# Patient Record
Sex: Female | Born: 1965 | Race: White | Hispanic: No | Smoking: Never smoker
Health system: Southern US, Community
[De-identification: ages and names within clinical notes are randomized; demographics above are authoritative.]

## PROBLEM LIST (undated history)

## (undated) DIAGNOSIS — F988 Other specified behavioral and emotional disorders with onset usually occurring in childhood and adolescence: Secondary | ICD-10-CM

## (undated) HISTORY — PX: FOOT SURGERY: SHX648

## (undated) HISTORY — PX: LEG SURGERY: SHX1003

---

## 2015-08-06 ENCOUNTER — Emergency Department (HOSPITAL_BASED_OUTPATIENT_CLINIC_OR_DEPARTMENT_OTHER): Payer: BLUE CROSS/BLUE SHIELD

## 2015-08-06 ENCOUNTER — Encounter (HOSPITAL_BASED_OUTPATIENT_CLINIC_OR_DEPARTMENT_OTHER): Payer: Self-pay | Admitting: Emergency Medicine

## 2015-08-06 ENCOUNTER — Emergency Department (HOSPITAL_BASED_OUTPATIENT_CLINIC_OR_DEPARTMENT_OTHER)
Admission: EM | Admit: 2015-08-06 | Discharge: 2015-08-06 | Disposition: A | Discharge status: Home / Self Care | Payer: BLUE CROSS/BLUE SHIELD | Attending: Emergency Medicine | Admitting: Emergency Medicine

## 2015-08-06 DIAGNOSIS — Z23 Encounter for immunization: Secondary | ICD-10-CM | POA: Insufficient documentation

## 2015-08-06 DIAGNOSIS — S80211A Abrasion, right knee, initial encounter: Secondary | ICD-10-CM | POA: Diagnosis not present

## 2015-08-06 DIAGNOSIS — S01111A Laceration without foreign body of right eyelid and periocular area, initial encounter: Secondary | ICD-10-CM | POA: Diagnosis not present

## 2015-08-06 DIAGNOSIS — S5001XA Contusion of right elbow, initial encounter: Secondary | ICD-10-CM | POA: Diagnosis not present

## 2015-08-06 DIAGNOSIS — Z79899 Other long term (current) drug therapy: Secondary | ICD-10-CM | POA: Diagnosis not present

## 2015-08-06 DIAGNOSIS — W07XXXA Fall from chair, initial encounter: Secondary | ICD-10-CM | POA: Diagnosis not present

## 2015-08-06 DIAGNOSIS — T07XXXA Unspecified multiple injuries, initial encounter: Secondary | ICD-10-CM

## 2015-08-06 DIAGNOSIS — Y929 Unspecified place or not applicable: Secondary | ICD-10-CM | POA: Insufficient documentation

## 2015-08-06 DIAGNOSIS — Y939 Activity, unspecified: Secondary | ICD-10-CM | POA: Diagnosis not present

## 2015-08-06 DIAGNOSIS — Y999 Unspecified external cause status: Secondary | ICD-10-CM | POA: Diagnosis not present

## 2015-08-06 DIAGNOSIS — M25521 Pain in right elbow: Secondary | ICD-10-CM | POA: Diagnosis present

## 2015-08-06 HISTORY — DX: Other specified behavioral and emotional disorders with onset usually occurring in childhood and adolescence: F98.8

## 2015-08-06 MED ORDER — OXYCODONE-ACETAMINOPHEN 5-325 MG PO TABS
1.0000 | ORAL_TABLET | Freq: Once | ORAL | Status: AC
  Administered 2015-08-06: 1 via ORAL

## 2015-08-06 MED ORDER — OXYCODONE-ACETAMINOPHEN 5-325 MG PO TABS
ORAL_TABLET | ORAL | Status: AC
  Filled 2015-08-06: qty 1

## 2015-08-06 MED ORDER — TRAMADOL HCL 50 MG PO TABS: 50.0000 mg | ORAL_TABLET | Freq: Four times a day (QID) | ORAL | Status: AC | PRN

## 2015-08-06 MED ORDER — TETANUS-DIPHTH-ACELL PERTUSSIS 5-2.5-18.5 LF-MCG/0.5 IM SUSP
0.5000 mL | Freq: Once | INTRAMUSCULAR | Status: AC
  Administered 2015-08-06: 0.5 mL via INTRAMUSCULAR
  Filled 2015-08-06: qty 0.5

## 2015-08-06 MED ORDER — OXYCODONE-ACETAMINOPHEN 5-325 MG PO TABS: 2.0000 | ORAL_TABLET | Freq: Once | ORAL | Status: DC

## 2015-08-06 NOTE — ED Notes (Signed)
MD at bedside. 

## 2015-08-06 NOTE — ED Provider Notes (Signed)
CSN: [id number]     Arrival date & time 08/06/15  1744 History  By signing my name below, I, Teresa Gray, attest that this documentation has been prepared under the direction and in the presence of Rolan BuccoMelanie Jarryd Gratz, MD. Electronically Signed: Bridgette HabermannMaria Gray, ED Scribe. 08/06/2015. 6:34 PM.       Chief Complaint  Patient presents with  . Joint Swelling    The history is provided by the patient and the spouse. No language interpreter was used.    HPI Comments: Teresa Gray is a 50 y.o. female who presents to the Emergency Department complaining of right elbow pain s/p mechanical fall this morning. Patient reports that she tripped on a chair, fell to the right and landed on her right side, subsequently striking her head on the ground. Denies LOC or other known injuries. She also complains of mild right wrist pain and abrasions to the lower extremities. Pt is ambulatory without difficulty. Tdap out of date. Patient denies dizziness, lightheadedness, HA, nausea, and vomiting. Dr. Louis MatteKenneth Lennon is her Orthopedic doctor.  Past Medical History  Diagnosis Date  . ADD (attention deficit disorder)    Past Surgical History  Procedure Laterality Date  . Foot surgery    . Leg surgery     History reviewed. No pertinent family history. Social History  Substance Use Topics  . Smoking status: Never Smoker   . Smokeless tobacco: None  . Alcohol Use: Yes     Comment: occ   OB History    No data available     Review of Systems  Constitutional: Negative for fever.  Gastrointestinal: Negative for nausea and vomiting.  Musculoskeletal: Positive for joint swelling (right elbow) and arthralgias (right elbow). Negative for back pain and neck pain.  Skin: Positive for wound.  Neurological: Negative for dizziness, syncope, weakness, numbness and headaches.  All other systems reviewed and are negative.   Allergies  Augmentin  Home Medications   Prior to Admission medications   Medication Sig Start Date  End Date Taking? Authorizing Provider  lisdexamfetamine (VYVANSE) 40 MG capsule Take 40 mg by mouth every morning.   Yes Historical Provider, MD  traMADol (ULTRAM) 50 MG tablet Take 1 tablet (50 mg total) by mouth every 6 (six) hours as needed. 08/06/15   Rolan BuccoMelanie Atiba Kimberlin, MD   BP 103/62 mmHg  Pulse 70  Temp(Src) 98 F (36.7 C) (Oral)  Resp 18  Ht 5\' 7"  (1.702 m)  Wt 150 lb (68.04 kg)  BMI 23.49 kg/m2  SpO2 98%  LMP 08/04/2015 Physical Exam  Constitutional: She is oriented to person, place, and time. She appears well-developed and well-nourished.  HENT:  Head: Normocephalic.  Neck: Normal range of motion. Neck supple.  No pain to cervical spine  Cardiovascular: Normal rate.   Pulmonary/Chest: Effort normal.  Musculoskeletal: She exhibits edema and tenderness.  One cm superficial laceration above right eyebrow. No hematoma. Tenderness over medial and lateral aspects of right elbow with mild swelling. Superficial abrasions to lower extremities. Mild swelling with overlying abrasion to right knee but no underlying bony tenderness. No other pain with palpation or ROM of other extremities. No pain with ROM of right shoulder. Mild pain with ROM of right wrist  Neurological: She is alert and oriented to person, place, and time.  Skin: Skin is warm and dry.  Psychiatric: She has a normal mood and affect.  Nursing note and vitals reviewed.   ED Course  Procedures (including critical care time) DIAGNOSTIC STUDIES: Oxygen Saturation is  98% on RA, normal by my interpretation.    COORDINATION OF CARE: 6:15 PM Discussed treatment plan with pt at bedside which includes x-ray of the elbow and pt agreed to plan.  Imaging Review Dg Elbow Complete Right  08/06/2015  CLINICAL DATA:  Status post fall, with right elbow pain. Initial encounter. EXAM: RIGHT ELBOW - COMPLETE 3+ VIEW COMPARISON:  None. FINDINGS: There is no evidence of fracture or dislocation. The visualized joint spaces are preserved. No  significant joint effusion is identified. The soft tissues are unremarkable in appearance. IMPRESSION: No evidence of fracture or dislocation. Electronically Signed   By: Roanna Raider M.D.   On: 08/06/2015 19:17   Dg Wrist Complete Right  08/06/2015  CLINICAL DATA:  Status post fall, with right wrist pain. Initial encounter. EXAM: RIGHT WRIST - COMPLETE 3+ VIEW COMPARISON:  None. FINDINGS: There is no evidence of fracture or dislocation. The carpal rows are intact, and demonstrate normal alignment. The joint spaces are preserved. No significant soft tissue abnormalities are seen. IMPRESSION: No evidence of fracture or dislocation. Electronically Signed   By: Roanna Raider M.D.   On: 08/06/2015 19:17   I have personally reviewed and evaluated these images as part of my medical decision-making.   MDM   Final diagnoses:  Multiple contusions  Abrasions of multiple sites    No fractures are identified. She's neurologically intact. She was advised in ice and elevation. She was given a sling to use for comfort. She was given a prescription for tramadol. She was encouraged to follow-up with her orthopedist.  I personally performed the services described in this documentation, which was scribed in my presence.  The recorded information has been reviewed and considered.      Rolan Bucco, MD 08/06/15 [phone]

## 2015-08-06 NOTE — ED Notes (Signed)
Pt refused to let me bring it to the proper height and angle and stated she would do it later. EMT told pt's husband how it should be worn and how to properly put the sling on.

## 2015-08-06 NOTE — ED Notes (Signed)
Patient states that she fell early this am and now having pain and swelling to her right elbow. [place]

## 2015-08-06 NOTE — ED Notes (Signed)
Pt reports feeling like passing out at discharge , discussed with EDP the pt's request for pain medication. Order received.

## 2020-12-23 ENCOUNTER — Other Ambulatory Visit (HOSPITAL_COMMUNITY): Payer: Self-pay | Admitting: Neurosurgery

## 2020-12-23 ENCOUNTER — Other Ambulatory Visit: Payer: Self-pay | Admitting: Neurosurgery

## 2020-12-23 DIAGNOSIS — G8929 Other chronic pain: Secondary | ICD-10-CM

## 2020-12-23 DIAGNOSIS — M5441 Lumbago with sciatica, right side: Secondary | ICD-10-CM

## 2021-01-04 ENCOUNTER — Ambulatory Visit (HOSPITAL_COMMUNITY)
Admission: RE | Admit: 2021-01-04 | Discharge: 2021-01-04 | Disposition: A | Discharge status: Home / Self Care | Payer: 59 | Source: Ambulatory Visit | Attending: Neurosurgery | Admitting: Neurosurgery

## 2021-01-04 ENCOUNTER — Other Ambulatory Visit: Payer: Self-pay

## 2021-01-04 DIAGNOSIS — M5441 Lumbago with sciatica, right side: Secondary | ICD-10-CM | POA: Diagnosis not present

## 2021-01-04 DIAGNOSIS — G8929 Other chronic pain: Secondary | ICD-10-CM | POA: Diagnosis present

## 2022-04-01 IMAGING — MR MR LUMBAR SPINE W/O CM
5 series · 31 of 48 positions shown · non-contrast
Comparison: None.

CLINICAL DATA: Chronic bilateral low back pain with right-sided
sciatica

EXAM:
MRI LUMBAR SPINE WITHOUT CONTRAST
TECHNIQUE: Multiplanar, multisequence MR imaging of the lumbar spine was
performed. No intravenous contrast was administered.

[Series 5: T1 · sagittal · 4.0mm · 0.81mm/px · 6 of 17 slices shown (1 of 2)]
[im 1/17]
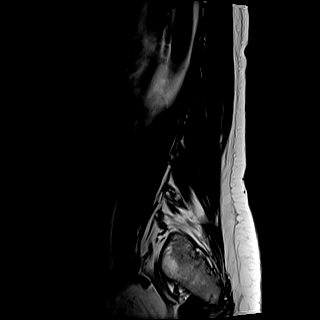
[im 4/17]
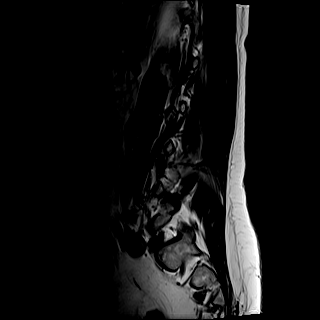
[im 7/17]
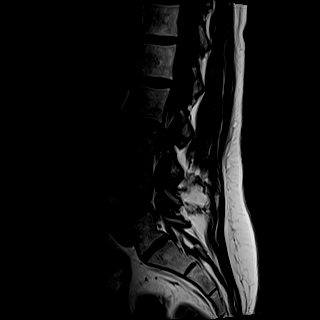
[im 10/17]
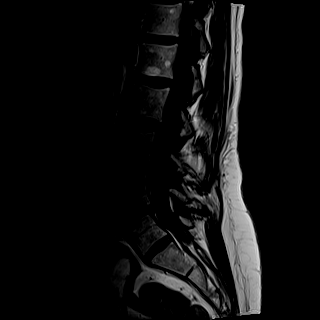
[im 13/17]
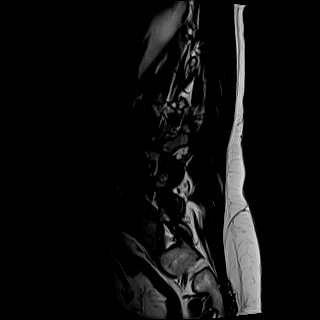
[im 17/17]
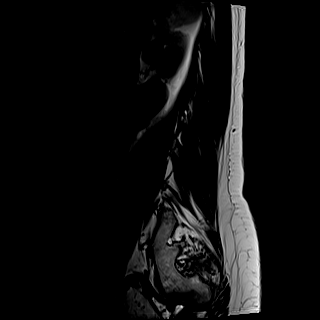

[Series 6: T2 · sagittal · 4.0mm · 0.81mm/px · 6 of 17 slices shown (1 of 2)]
[im 1/17]
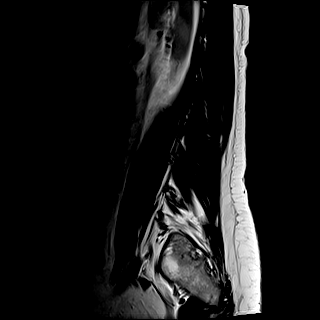
[im 4/17]
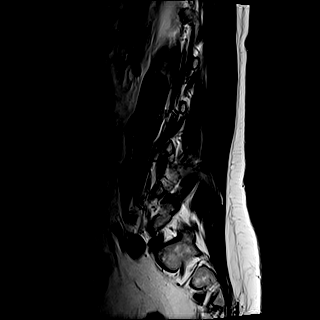
[im 7/17]
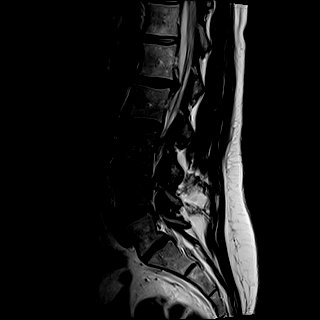
[im 10/17]
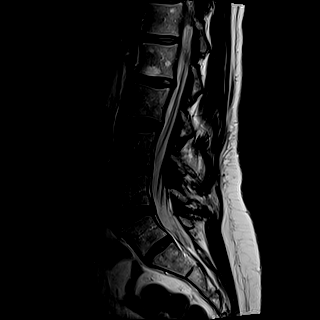
[im 13/17]
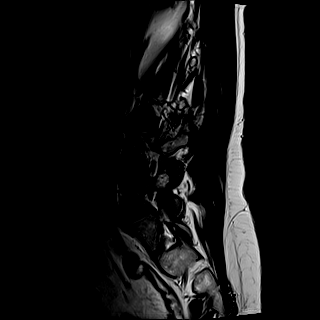
[im 17/17]
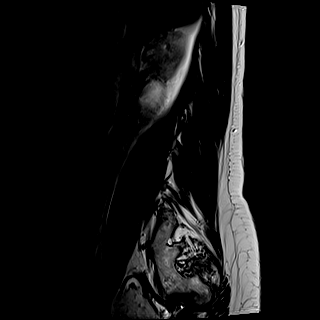

[Series 7: STIR · sagittal · 4.0mm · 0.51mm/px · 1 of 17 slices shown]
[im 1/17]
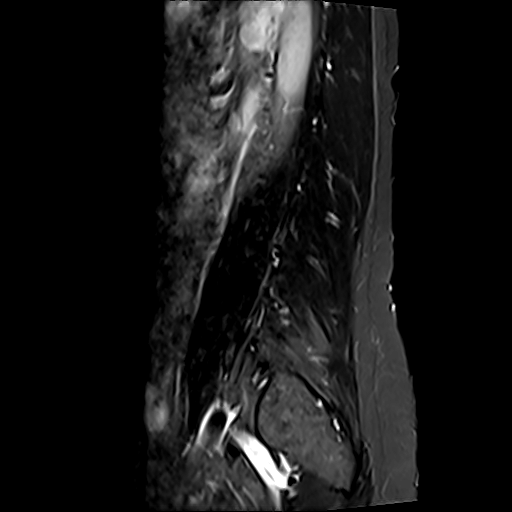

[Series 8: T2 · axial · 4.0mm · 0.62mm/px · z∈[-48,+174]mm · 9 of 42 slices shown (2 of 2)]
[im 1/42]
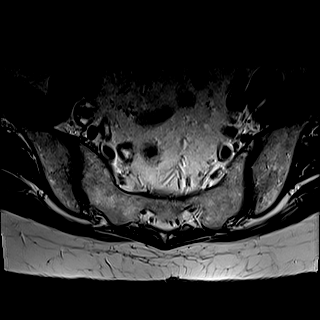
[im 6/42]
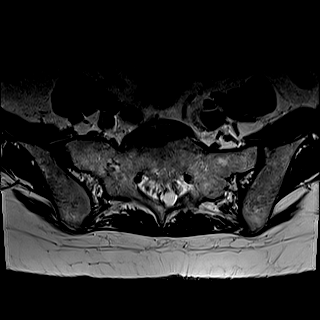
[im 12/42]
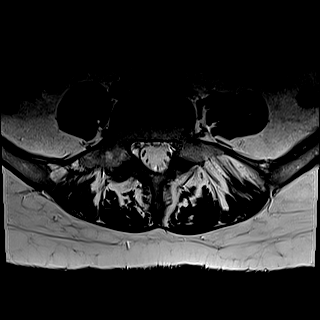
[im 18/42]
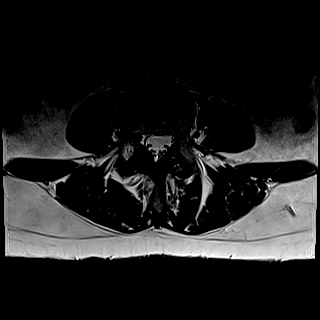
[im 21/42]
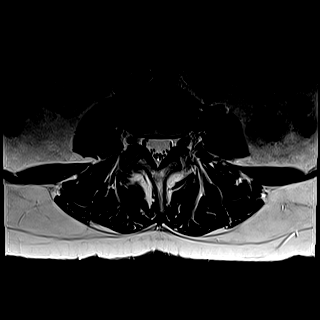
[im 24/42]
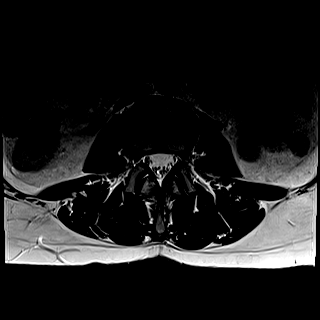
[im 30/42]
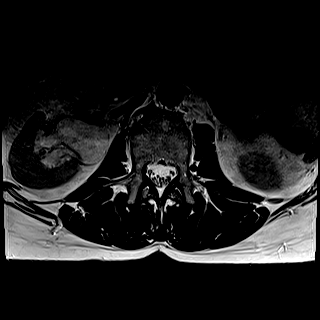
[im 36/42]
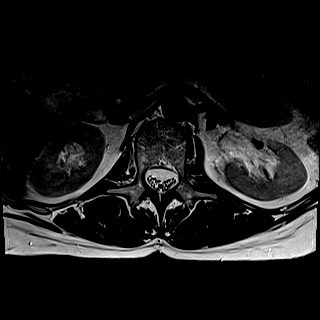
[im 42/42]
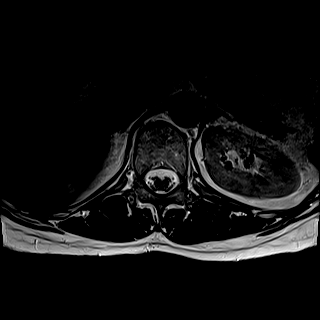

[Series 9: T1 · axial · 4.0mm · 0.39mm/px · z∈[-48,+174]mm · 9 of 42 slices shown (2 of 2)]
[im 1/42]
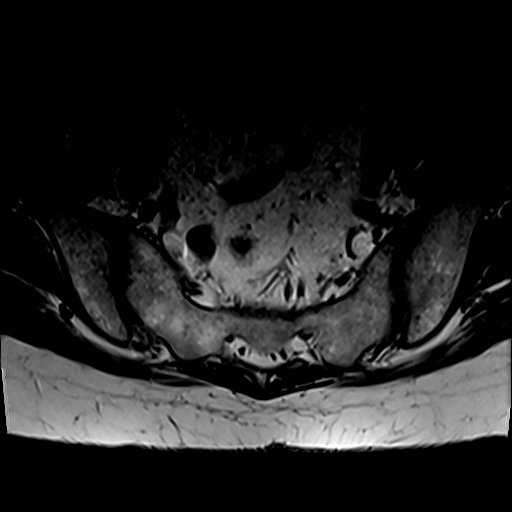
[im 6/42]
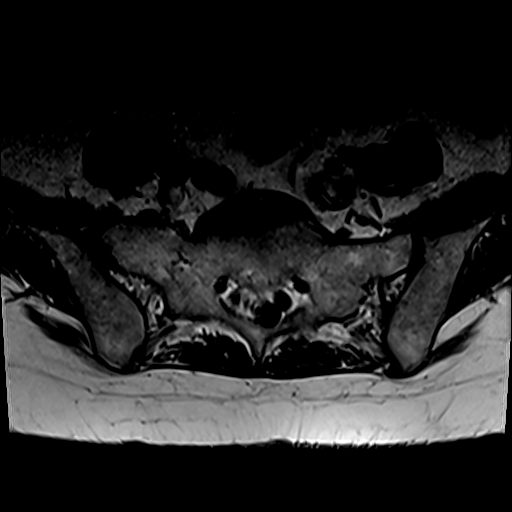
[im 12/42]
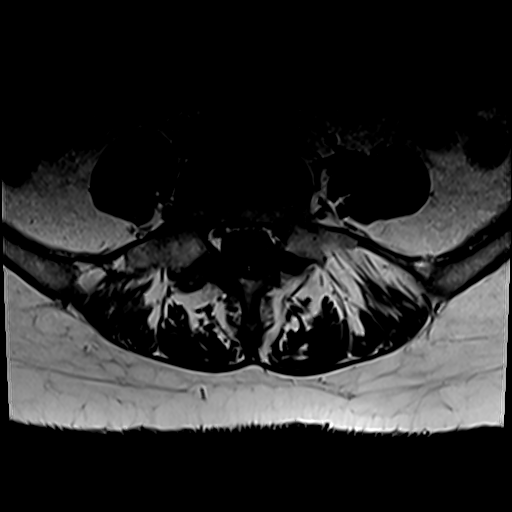
[im 18/42]
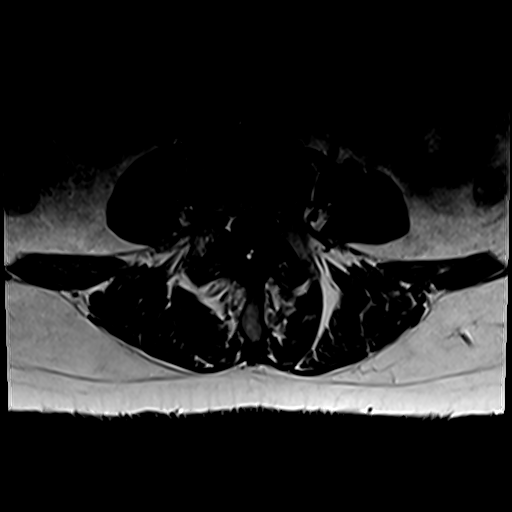
[im 21/42]
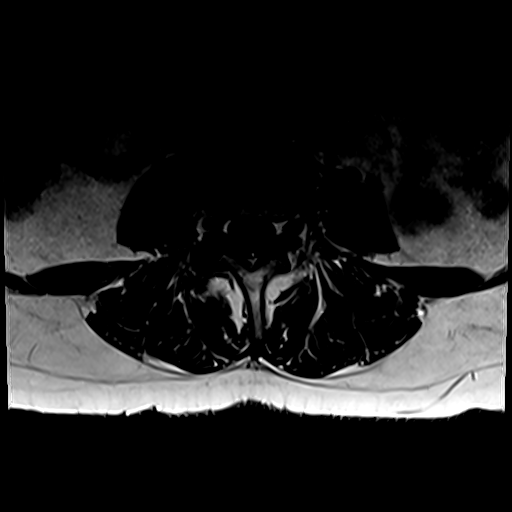
[im 24/42]
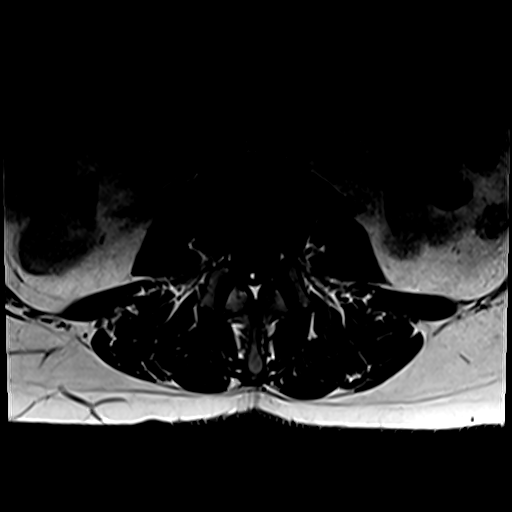
[im 30/42]
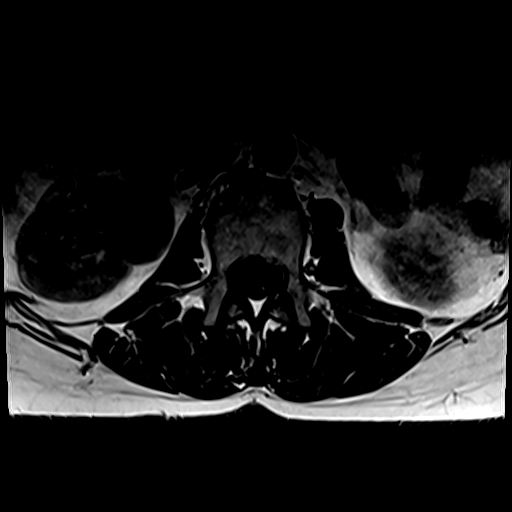
[im 36/42]
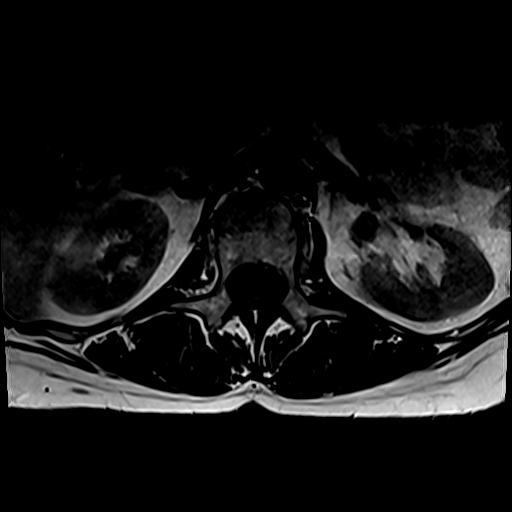
[im 42/42]
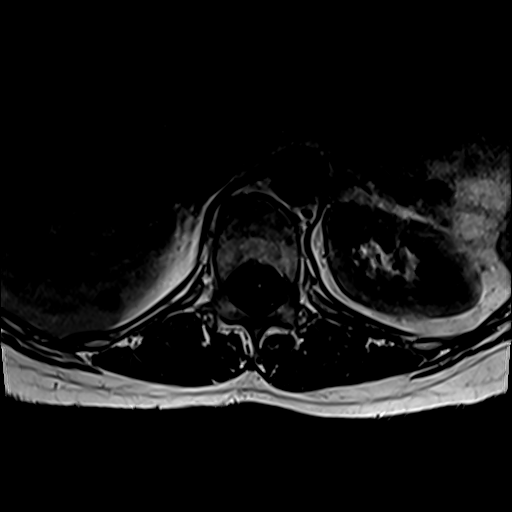

[31 of 48 positions shown; findings below may reference images not displayed]

FINDINGS: Segmentation: Transitional anatomy at the lumbosacral junction. For
the purposes of this dictation, there is partial sacralization of
L5.

Alignment:  Trace retrolisthesis at L2-L3.

Vertebrae: Vertebral body heights are maintained. Probable small
hemangioma involving the right posterior elements of L4. No marrow
edema. No suspicious osseous lesion.

Conus medullaris and cauda equina: Conus extends to the L1 level.
Conus and cauda equina appear normal.

Paraspinal and other soft tissues: Partially imaged T2 hyperintense
lesions of the inferior right liver statistically probably
reflecting cysts.

Disc levels:

L1-L2: Broad-based central disc protrusion with superimposed left
central/subarticular component. No canal stenosis. Partial
effacement left subarticular recess. No foraminal stenosis.

L2-L3:  No canal or foraminal stenosis.

L3-L4:  Mild facet arthropathy.  No canal or foraminal stenosis.

L4-L5:  Mild facet arthropathy.  No canal or foraminal stenosis.

L5-S1:  No canal or foraminal stenosis.
IMPRESSION: Mild degenerative changes as detailed above without high-grade
stenosis.
